# Patient Record
Sex: Male | Born: 1970 | Race: Black or African American | Hispanic: No | Smoking: Current some day smoker
Health system: Southern US, Community
[De-identification: ages and names within clinical notes are randomized; demographics above are authoritative.]

## PROBLEM LIST (undated history)

## (undated) DIAGNOSIS — R569 Unspecified convulsions: Secondary | ICD-10-CM

## (undated) DIAGNOSIS — J329 Chronic sinusitis, unspecified: Secondary | ICD-10-CM

## (undated) DIAGNOSIS — K219 Gastro-esophageal reflux disease without esophagitis: Secondary | ICD-10-CM

## (undated) DIAGNOSIS — G4730 Sleep apnea, unspecified: Secondary | ICD-10-CM

## (undated) DIAGNOSIS — I10 Essential (primary) hypertension: Secondary | ICD-10-CM

## (undated) HISTORY — PX: APPENDECTOMY: SHX54

---

## 1998-10-27 ENCOUNTER — Emergency Department (HOSPITAL_COMMUNITY): Admission: EM | Admit: 1998-10-27 | Discharge: 1998-10-27 | Payer: Self-pay | Admitting: Emergency Medicine

## 1998-11-08 ENCOUNTER — Emergency Department (HOSPITAL_COMMUNITY): Admission: EM | Admit: 1998-11-08 | Discharge: 1998-11-08 | Payer: Self-pay | Admitting: Emergency Medicine

## 1999-01-23 ENCOUNTER — Emergency Department (HOSPITAL_COMMUNITY): Admission: EM | Admit: 1999-01-23 | Discharge: 1999-01-23 | Payer: Self-pay | Admitting: Emergency Medicine

## 2003-05-30 ENCOUNTER — Emergency Department (HOSPITAL_COMMUNITY): Admission: EM | Admit: 2003-05-30 | Discharge: 2003-05-30 | Payer: Self-pay | Admitting: Emergency Medicine

## 2004-09-05 ENCOUNTER — Emergency Department (HOSPITAL_COMMUNITY): Admission: EM | Admit: 2004-09-05 | Discharge: 2004-09-05 | Payer: Self-pay | Admitting: Emergency Medicine

## 2005-11-21 ENCOUNTER — Emergency Department (HOSPITAL_COMMUNITY): Admission: EM | Admit: 2005-11-21 | Discharge: 2005-11-21 | Payer: Self-pay | Admitting: Emergency Medicine

## 2007-05-03 ENCOUNTER — Emergency Department (HOSPITAL_COMMUNITY): Admission: EM | Admit: 2007-05-03 | Discharge: 2007-05-03 | Payer: Self-pay | Admitting: Emergency Medicine

## 2007-05-05 ENCOUNTER — Emergency Department (HOSPITAL_COMMUNITY): Admission: EM | Admit: 2007-05-05 | Discharge: 2007-05-05 | Payer: Self-pay | Admitting: Emergency Medicine

## 2008-09-27 ENCOUNTER — Emergency Department (HOSPITAL_COMMUNITY): Admission: EM | Admit: 2008-09-27 | Discharge: 2008-09-27 | Payer: Self-pay | Admitting: Emergency Medicine

## 2010-11-27 LAB — BASIC METABOLIC PANEL
BUN: 7 mg/dL (ref 6–23)
Calcium: 8.9 mg/dL (ref 8.4–10.5)
Chloride: 107 mEq/L (ref 96–112)
GFR calc Af Amer: >60 mL/min (ref >60)
GFR calc non Af Amer: 52 mL/min — ABNORMAL LOW (ref >60)
Glucose, Bld: 113 mg/dL — ABNORMAL HIGH (ref 70–99)
Sodium: 143 mEq/L (ref 135–145)

## 2010-11-27 LAB — POCT CARDIAC MARKERS
Myoglobin, poc: 69.5 ng/mL (ref 12–200)
Troponin i, poc: <0.05 ng/mL (ref 0.00–0.09)
Troponin i, poc: <0.05 ng/mL (ref 0.00–0.09)

## 2010-11-27 LAB — CBC
HCT: 48.5 % (ref 39.0–52.0)
MCHC: 34.2 g/dL (ref 30.0–36.0)
MCV: 93.7 fL (ref 78.0–100.0)
RBC: 5.17 MIL/uL (ref 4.22–5.81)
RDW: 14.7 % (ref 11.5–15.5)
WBC: 4.3 10*3/uL (ref 4.0–10.5)

## 2010-11-27 LAB — DIFFERENTIAL
Lymphocytes Relative: 54 % — ABNORMAL HIGH (ref 12–46)
Lymphs Abs: 2.3 10*3/uL (ref 0.7–4.0)
Neutrophils Relative %: 33 % — ABNORMAL LOW (ref 43–77)

## 2011-02-14 ENCOUNTER — Emergency Department (HOSPITAL_COMMUNITY)
Admission: EM | Admit: 2011-02-14 | Discharge: 2011-02-14 | Disposition: A | Discharge status: Home / Self Care | Payer: Non-veteran care | Attending: Emergency Medicine | Admitting: Emergency Medicine

## 2011-02-14 DIAGNOSIS — M109 Gout, unspecified: Secondary | ICD-10-CM | POA: Insufficient documentation

## 2011-02-14 DIAGNOSIS — I10 Essential (primary) hypertension: Secondary | ICD-10-CM | POA: Insufficient documentation

## 2011-02-14 DIAGNOSIS — M79609 Pain in unspecified limb: Secondary | ICD-10-CM | POA: Insufficient documentation

## 2011-04-04 ENCOUNTER — Emergency Department (HOSPITAL_COMMUNITY)
Admission: EM | Admit: 2011-04-04 | Discharge: 2011-04-04 | Disposition: A | Discharge status: Home / Self Care | Payer: Non-veteran care | Attending: Emergency Medicine | Admitting: Emergency Medicine

## 2011-04-04 DIAGNOSIS — G40909 Epilepsy, unspecified, not intractable, without status epilepticus: Secondary | ICD-10-CM | POA: Insufficient documentation

## 2011-04-04 DIAGNOSIS — M7989 Other specified soft tissue disorders: Secondary | ICD-10-CM | POA: Insufficient documentation

## 2011-04-04 DIAGNOSIS — I10 Essential (primary) hypertension: Secondary | ICD-10-CM | POA: Insufficient documentation

## 2011-04-04 DIAGNOSIS — M79609 Pain in unspecified limb: Secondary | ICD-10-CM | POA: Insufficient documentation

## 2011-04-04 DIAGNOSIS — K219 Gastro-esophageal reflux disease without esophagitis: Secondary | ICD-10-CM | POA: Insufficient documentation

## 2011-04-04 DIAGNOSIS — M109 Gout, unspecified: Secondary | ICD-10-CM | POA: Insufficient documentation

## 2011-04-04 DIAGNOSIS — Z79899 Other long term (current) drug therapy: Secondary | ICD-10-CM | POA: Insufficient documentation

## 2013-07-29 ENCOUNTER — Encounter (HOSPITAL_COMMUNITY): Payer: Self-pay | Admitting: Emergency Medicine

## 2013-07-29 ENCOUNTER — Emergency Department (HOSPITAL_COMMUNITY)
Admission: EM | Admit: 2013-07-29 | Discharge: 2013-07-29 | Disposition: A | Discharge status: Home / Self Care | Payer: Non-veteran care | Attending: Emergency Medicine | Admitting: Emergency Medicine

## 2013-07-29 DIAGNOSIS — I10 Essential (primary) hypertension: Secondary | ICD-10-CM | POA: Insufficient documentation

## 2013-07-29 DIAGNOSIS — R040 Epistaxis: Secondary | ICD-10-CM | POA: Insufficient documentation

## 2013-07-29 DIAGNOSIS — F17200 Nicotine dependence, unspecified, uncomplicated: Secondary | ICD-10-CM | POA: Insufficient documentation

## 2013-07-29 DIAGNOSIS — Z8669 Personal history of other diseases of the nervous system and sense organs: Secondary | ICD-10-CM | POA: Insufficient documentation

## 2013-07-29 HISTORY — DX: Essential (primary) hypertension: I10

## 2013-07-29 HISTORY — DX: Unspecified convulsions: R56.9

## 2013-07-29 MED ORDER — SALINE SPRAY 0.65 % NA SOLN
1.0000 | Freq: Once | NASAL | Status: AC
  Administered 2013-07-29: 1 via NASAL
  Filled 2013-07-29: qty 44

## 2013-07-29 MED ORDER — OXYMETAZOLINE HCL 0.05 % NA SOLN
1.0000 | Freq: Once | NASAL | Status: AC
  Administered 2013-07-29: 1 via NASAL
  Filled 2013-07-29: qty 15

## 2013-07-29 NOTE — ED Notes (Signed)
Walked in to assess patient again,nose bleed has stopped.

## 2013-07-29 NOTE — ED Provider Notes (Signed)
CSN: [id number]     Arrival date & time 07/29/13  0231 History   First MD Initiated Contact with Patient 07/29/13 0407     Chief Complaint  Patient presents with  . Epistaxis   (Consider location/radiation/quality/duration/timing/severity/associated sxs/prior Treatment) Patient is a 42 y.o. male presenting with nosebleeds. The history is provided by the patient.  Epistaxis Location:  Bilateral Severity:  Mild Timing:  Intermittent Progression:  Waxing and waning Chronicity:  New Context: not anticoagulants, not aspirin use, not drug use, not nose picking and not trauma   Relieved by:  Nothing Ineffective treatments:  Applying pressure Associated symptoms: no fever   Risk factors: no alcohol use, no frequent nosebleeds and no liver disease     Past Medical History  Diagnosis Date  . Seizures   . Hypertension    History reviewed. No pertinent past surgical history. No family history on file. History  Substance Use Topics  . Smoking status: Current Every Day Smoker  . Smokeless tobacco: Not on file  . Alcohol Use: Yes    Review of Systems  Constitutional: Negative for fever.  HENT: Positive for nosebleeds.   Skin: Negative for rash and wound.  Allergic/Immunologic: Negative for immunocompromised state.  Hematological: Does not bruise/bleed easily.    Allergies  Dilantin  Home Medications  No current outpatient prescriptions on file. BP 133/89  Pulse 90  Temp(Src) 97.7 F (36.5 C) (Oral)  Resp 16  SpO2 94% Physical Exam  Nursing note and vitals reviewed. Constitutional: He appears well-developed.  HENT:  Head: Normocephalic and atraumatic.  L nare - pt has anterior bleeding source - with a scab. No active bleeding. R Nare - unable to locate the bleeding spot, but there is no active bleeding.  Cardiovascular: Normal rate.   Pulmonary/Chest: Effort normal and breath sounds normal.    ED Course  Procedures (including critical care time) Labs Review Labs  Reviewed - No data to display Imaging Review No results found.  EKG Interpretation   None       MDM   1. Epistaxis    Pt comes in with epistaxis. No active bleeding during my eval - but he was having bleeding at arrival.  No red flags on hx or exam. Will give afrin and nasal spry from the ED. Pt advised to use pressure for 5 min, uninterrupted x 2 and then AFRIN use - and to come to the ED if unsuccessful with those attempts.  Since there is no active bleeding, there is no active intervention needed.   The patient was counseled on the dangers of tobacco use, and was advised to quit.  Reviewed strategies to maximize success, including removing cigarettes and smoking materials from environment. Discussion 2-3 minutes   Derwood Kaplan, MD 07/29/13 [phone]

## 2013-07-29 NOTE — ED Notes (Signed)
C/o intermittent nosebleed since yesterday. Nose bleeding on arrival.

## 2013-09-13 ENCOUNTER — Emergency Department (HOSPITAL_COMMUNITY)
Admission: EM | Admit: 2013-09-13 | Discharge: 2013-09-13 | Disposition: A | Discharge status: Home / Self Care | Payer: No Typology Code available for payment source | Attending: Emergency Medicine | Admitting: Emergency Medicine

## 2013-09-13 ENCOUNTER — Emergency Department (HOSPITAL_COMMUNITY): Payer: No Typology Code available for payment source

## 2013-09-13 ENCOUNTER — Encounter (HOSPITAL_COMMUNITY): Payer: Self-pay | Admitting: Emergency Medicine

## 2013-09-13 DIAGNOSIS — Y9239 Other specified sports and athletic area as the place of occurrence of the external cause: Secondary | ICD-10-CM | POA: Insufficient documentation

## 2013-09-13 DIAGNOSIS — S99929A Unspecified injury of unspecified foot, initial encounter: Secondary | ICD-10-CM

## 2013-09-13 DIAGNOSIS — Y92838 Other recreation area as the place of occurrence of the external cause: Secondary | ICD-10-CM

## 2013-09-13 DIAGNOSIS — S99919A Unspecified injury of unspecified ankle, initial encounter: Secondary | ICD-10-CM

## 2013-09-13 DIAGNOSIS — G40909 Epilepsy, unspecified, not intractable, without status epilepticus: Secondary | ICD-10-CM | POA: Insufficient documentation

## 2013-09-13 DIAGNOSIS — S199XXA Unspecified injury of neck, initial encounter: Secondary | ICD-10-CM

## 2013-09-13 DIAGNOSIS — S335XXA Sprain of ligaments of lumbar spine, initial encounter: Secondary | ICD-10-CM | POA: Insufficient documentation

## 2013-09-13 DIAGNOSIS — F17200 Nicotine dependence, unspecified, uncomplicated: Secondary | ICD-10-CM | POA: Insufficient documentation

## 2013-09-13 DIAGNOSIS — S8990XA Unspecified injury of unspecified lower leg, initial encounter: Secondary | ICD-10-CM | POA: Insufficient documentation

## 2013-09-13 DIAGNOSIS — I10 Essential (primary) hypertension: Secondary | ICD-10-CM | POA: Insufficient documentation

## 2013-09-13 DIAGNOSIS — S39012A Strain of muscle, fascia and tendon of lower back, initial encounter: Secondary | ICD-10-CM

## 2013-09-13 DIAGNOSIS — Z79899 Other long term (current) drug therapy: Secondary | ICD-10-CM | POA: Insufficient documentation

## 2013-09-13 DIAGNOSIS — S0993XA Unspecified injury of face, initial encounter: Secondary | ICD-10-CM | POA: Insufficient documentation

## 2013-09-13 DIAGNOSIS — Y9389 Activity, other specified: Secondary | ICD-10-CM | POA: Insufficient documentation

## 2013-09-13 MED ORDER — HYDROCODONE-ACETAMINOPHEN 5-325 MG PO TABS
1.0000 | ORAL_TABLET | Freq: Once | ORAL | Status: AC
Start: 2013-09-13 — End: 2013-09-13
  Administered 2013-09-13: 1 via ORAL
  Filled 2013-09-13: qty 1

## 2013-09-13 MED ORDER — HYDROCODONE-ACETAMINOPHEN 5-325 MG PO TABS: 1.0000 | ORAL_TABLET | Freq: Four times a day (QID) | ORAL | Status: DC | PRN

## 2013-09-13 NOTE — ED Notes (Signed)
Patient transported to X-ray 

## 2013-09-13 NOTE — ED Notes (Signed)
Return from xray
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
## 2018-02-24 NOTE — Pre-Procedure Instructions (Signed)
Terence LuxGerald L Goller  02/24/2018     No Pharmacies Listed   Your procedure is scheduled on March 04, 2018.  [facility] at 640 AM.  Call this number if you have problems the morning of surgery:  [phone]   Remember:  Do not eat or drink after midnight.    Take these medicines the morning of surgery with A SIP OF WATER  Allopurinol (zyloprim) Fluticasone (flonase)-if needed levetiracetam (keppra) Pantoprazole (protonix)  7 days prior to surgery STOP taking any Aspirin (unless otherwise instructed by your surgeon), Aleve, Naproxen, Ibuprofen, Motrin, Advil, Goody's, BC's, all herbal medications, fish oil, and all vitamins   Do not wear jewelry, make-up or nail polish.  Do not wear lotions, powders, or perfumes, or deodorant.   Men may shave face and neck.  Do not bring valuables to the hospital.  [facility] is not responsible for any belongings or valuables.  Contacts, dentures or bridgework may not be worn into surgery.  Leave your suitcase in the car.  After surgery it may be brought to your room.  For patients admitted to the hospital, discharge time will be determined by your treatment team.  Patients discharged the day of surgery will not be allowed to drive home.    [place]- Preparing For Surgery  Before surgery, you can play an important role. Because skin is not sterile, your skin needs to be as free of germs as possible. You can reduce the number of germs on your skin by washing with CHG (chlorahexidine gluconate) Soap before surgery.  CHG is an antiseptic cleaner which kills germs and bonds with the skin to continue killing germs even after washing.    Oral Hygiene is also important to reduce your risk of infection.  Remember - BRUSH YOUR TEETH THE MORNING OF SURGERY WITH YOUR REGULAR TOOTHPASTE  Please do not use if you have an allergy to CHG or antibacterial soaps. If your skin becomes reddened/irritated stop using the CHG.  Do  not shave (including legs and underarms) for at least 48 hours prior to first CHG shower. It is OK to shave your face.  Please follow these instructions carefully.   1. Shower the NIGHT BEFORE SURGERY and the MORNING OF SURGERY with CHG.   2. If you chose to wash your hair, wash your hair first as usual with your normal shampoo.  3. After you shampoo, rinse your hair and body thoroughly to remove the shampoo.  4. Use CHG as you would any other liquid soap. You can apply CHG directly to the skin and wash gently with a scrungie or a clean washcloth.   5. Apply the CHG Soap to your body ONLY FROM THE NECK DOWN.  Do not use on open wounds or open sores. Avoid contact with your eyes, ears, mouth and genitals (private parts). Wash Face and genitals (private parts)  with your normal soap.  6. Wash thoroughly, paying special attention to the area where your surgery will be performed.  7. Thoroughly rinse your body with warm water from the neck down.  8. DO NOT shower/wash with your normal soap after using and rinsing off the CHG Soap.  9. Pat yourself dry with a CLEAN TOWEL.  10. Wear CLEAN PAJAMAS to bed the night before surgery, wear comfortable clothes the morning of surgery  11. Place CLEAN SHEETS on your bed the night of your first shower and DO NOT SLEEP WITH PETS.  Day of  Surgery:  Do not apply any deodorants/lotions.  Please wear clean clothes to the hospital/surgery center.   Remember to brush your teeth WITH YOUR REGULAR TOOTHPASTE.  Please read over the following fact sheets that you were given. Pain Booklet, Coughing and Deep Breathing and Surgical Site Infection Prevention

## 2018-02-25 ENCOUNTER — Other Ambulatory Visit (HOSPITAL_COMMUNITY): Payer: Non-veteran care

## 2018-02-25 ENCOUNTER — Encounter (HOSPITAL_COMMUNITY): Payer: Self-pay

## 2018-02-25 ENCOUNTER — Other Ambulatory Visit: Payer: Self-pay

## 2018-02-25 ENCOUNTER — Encounter (HOSPITAL_COMMUNITY)
Admission: RE | Admit: 2018-02-25 | Discharge: 2018-02-25 | Disposition: A | Discharge status: Home / Self Care | Payer: No Typology Code available for payment source | Source: Ambulatory Visit | Attending: Otolaryngology | Admitting: Otolaryngology

## 2018-02-25 DIAGNOSIS — I10 Essential (primary) hypertension: Secondary | ICD-10-CM | POA: Diagnosis not present

## 2018-02-25 DIAGNOSIS — J329 Chronic sinusitis, unspecified: Secondary | ICD-10-CM | POA: Insufficient documentation

## 2018-02-25 DIAGNOSIS — Z01812 Encounter for preprocedural laboratory examination: Secondary | ICD-10-CM | POA: Diagnosis not present

## 2018-02-25 DIAGNOSIS — K219 Gastro-esophageal reflux disease without esophagitis: Secondary | ICD-10-CM | POA: Diagnosis not present

## 2018-02-25 DIAGNOSIS — Z79899 Other long term (current) drug therapy: Secondary | ICD-10-CM | POA: Insufficient documentation

## 2018-02-25 DIAGNOSIS — G4733 Obstructive sleep apnea (adult) (pediatric): Secondary | ICD-10-CM | POA: Insufficient documentation

## 2018-02-25 DIAGNOSIS — F17200 Nicotine dependence, unspecified, uncomplicated: Secondary | ICD-10-CM | POA: Diagnosis not present

## 2018-02-25 DIAGNOSIS — Z7951 Long term (current) use of inhaled steroids: Secondary | ICD-10-CM | POA: Diagnosis not present

## 2018-02-25 DIAGNOSIS — R569 Unspecified convulsions: Secondary | ICD-10-CM | POA: Diagnosis not present

## 2018-02-25 DIAGNOSIS — Z01818 Encounter for other preprocedural examination: Secondary | ICD-10-CM | POA: Insufficient documentation

## 2018-02-25 HISTORY — DX: Gastro-esophageal reflux disease without esophagitis: K21.9

## 2018-02-25 HISTORY — DX: Sleep apnea, unspecified: G47.30

## 2018-02-25 HISTORY — DX: Chronic sinusitis, unspecified: J32.9

## 2018-02-25 LAB — BASIC METABOLIC PANEL
Anion gap: 12 (ref 5–15)
BUN: 13 mg/dL (ref 6–20)
CALCIUM: 9.2 mg/dL (ref 8.9–10.3)
CHLORIDE: 104 mmol/L (ref 98–111)
CO2: 25 mmol/L (ref 22–32)
CREATININE: 1.34 mg/dL — AB (ref 0.61–1.24)
GFR calc Af Amer: >60 mL/min (ref >60)
GFR calc non Af Amer: >60 mL/min (ref >60)
GLUCOSE: 112 mg/dL — AB (ref 70–99)
Potassium: 3.9 mmol/L (ref 3.5–5.1)
Sodium: 141 mmol/L (ref 135–145)

## 2018-02-25 LAB — CBC
HCT: 45.9 % (ref 39.0–52.0)
Hemoglobin: 15 g/dL (ref 13.0–17.0)
MCH: 29 pg (ref 26.0–34.0)
MCHC: 32.7 g/dL (ref 30.0–36.0)
MCV: 88.6 fL (ref 78.0–100.0)
PLATELETS: 288 10*3/uL (ref 150–400)
RBC: 5.18 MIL/uL (ref 4.22–5.81)
RDW: 13.6 % (ref 11.5–15.5)
WBC: 5.6 10*3/uL (ref 4.0–10.5)

## 2018-02-25 NOTE — Progress Notes (Addendum)
[facility]  Cardiologist - Denies  Chest x-ray - Denies  EKG - 02/25/18  Stress Test - Denies  ECHO - Denies  Cardiac Cath - Denies  Sleep Study - Yes- Positive CPAP - Yes  LABS- 02/25/18: CBC, BMP  ASA- Denies  Due to pt's elevated BP, pt made aware that he needs to f/u with his pcp. Pt denied h/a, dizziness, or blurred vision. Pt sts his diastolic runs at 98 at home. Pt sts he has an appt on 7/18, for a bp check. Pt also made aware that his surgery can be cancelled due to an abnormal bp reading.  Anesthesia- Yes- EKG/ elevated bp  Pt denies having chest pain, sob, or fever at this time. All instructions explained to the pt, with a verbal understanding of the material. Pt agrees to go over the instructions while at home for a better understanding. The opportunity to ask questions was provided.

## 2018-02-26 ENCOUNTER — Encounter (HOSPITAL_COMMUNITY): Payer: Self-pay | Admitting: Certified Registered Nurse Anesthetist

## 2018-02-26 ENCOUNTER — Encounter (HOSPITAL_COMMUNITY): Payer: Self-pay | Admitting: Emergency Medicine

## 2018-02-26 NOTE — Progress Notes (Signed)
Anesthesia Chart Review:   Case:  [id number] Date/Time:  03/04/18 0830   Procedure:  ENDOSCOPIC SINUS SURGERY/ethmoid, maxillary, frontal, sphenoid sinus (Bilateral )   Anesthesia type:  General   Pre-op diagnosis:  Chronic sinusitis   Location:  [facility] ROOM 09 / [facility] OR   Surgeon:  Serena Colonelosen, Jefry, MD      DISCUSSION: -Pt is a 47 year old male with hx HTN, OSA, seizures. Current smoker.   - BP initially 176/104.  Recheck 163/107.  Pt to f/u with PCP about uncontrolled HTN.  Pt understands surgery could be cancelled for high BP. I left voicemail for [facility] Anaracy in Dr. Lucky Rathkeosen's office about pt's uncontrolled HTN.    VS: BP (!) 163/107 Comment: Repeat/ Left  Pulse 87   Temp 36.7 C   Resp 20   Ht 5\' 7"  (1.702 m)   Wt 211 lb 11.2 oz (96 kg)   SpO2 98%   BMI 33.16 kg/m    PROVIDERS: - [facility]   LABS: Labs reviewed: Acceptable for surgery. (all labs ordered are listed, but only abnormal results are displayed)  Labs Reviewed  BASIC METABOLIC PANEL - Abnormal; Notable for the following components:      Result Value   Glucose, Bld 112 (*)    Creatinine, Ser 1.34 (*)    All other components within normal limits  CBC    EKG 02/25/18: NSR. Cannot rule out Anterior infarct, age undetermined   Past Medical History:  Diagnosis Date  . Chronic sinusitis   . GERD (gastroesophageal reflux disease)   . Hypertension   . Seizures (HCC)   . Sleep apnea    Uses Cpap    Past Surgical History:  Procedure Laterality Date  . APPENDECTOMY      MEDICATIONS: . allopurinol (ZYLOPRIM) 300 MG tablet  . fluticasone (FLONASE) 50 MCG/ACT nasal spray  . hydrocortisone cream 1 %  . levETIRAcetam (KEPPRA) 250 MG tablet  . losartan (COZAAR) 100 MG tablet  . pantoprazole (PROTONIX) 20 MG tablet   No current facility-administered medications for this encounter.     If BP acceptable day of surgery, I anticipate pt can proceed with surgery as scheduled.  Rica Mastngela Kolleen Ochsner,  FNP-BC [facility]Anesthesiology Phone: [phone] 02/26/2018 12:17 PM

## 2018-03-03 NOTE — H&P (Signed)
Chief Complaint  Patient presents with  . Chronic sinus   Parker Wherley is a 47 y.o. male who presents as a new patient for chronic rhinitis and sinusitis. For the past year he reports fluctuating nasal congestion and clear rhinorrhea. Rhinorrhea does not occur daily but frequently. It is bilateral. Also reports some midfacial pressure along the cheek sinuses and nose. He uses Flonase periodically if he feels he needs it. No use of oral anti-histamines. Recent sinus CT ordered through the [place] on 11/20/17 demonstrating a prominent nasal septal deviation, chronic pansinusitis with mild polypoid mucoperiosteal thickening with obstruction of the maxillary sinus ostia nad hiatus semilunaris but no sinus effusion to suggest acute sinusitis (per report). Small maxillary sinus retention cysts noted bilaterally as well.  No recent antibiotics. He uses CPAP for OSA.  Denies fever, headache, purulent rhinorrhea, acute vision changes, dizziness, ear or throat symptoms.  No PMH of asthma or aspirin sensitivity. He denies a history of allergies or recurrent sinusitis. No history of nasal trauma or sinonasal surgery. No formal allergy testing.  Current smoker.  PMH/Meds/All/SocHx/FamHx/ROS:   Past Medical History      Past Medical History:  Diagnosis Date  . Allergy   . Hyperlexia   . Hypertension       Past Surgical History       Past Surgical History:  Procedure Laterality Date  . APPENDECTOMY        No family history of bleeding disorders, wound healing problems or difficulty with anesthesia.   Social History  Social History        Social History  . Marital status: Married    Spouse name: N/A  . Number of children: N/A  . Years of education: N/A      Occupational History  . Not on file.           Social History Main Topics    . Smoking status: Current Every Day Smoker   . Smokeless tobacco: Never Used   . Alcohol use Yes      Comment: Social    .  Drug use: Unknown  . Sexual activity: Not on file        Other Topics Concern  . Not on file      Social History Narrative  . No narrative on file       Current Outpatient Prescriptions:  .  allopurinol (ZYLOPRIM) 300 MG tablet, Take 300 mg by mouth daily., Disp: , Rfl:  .  fluticasone propionate (FLOVENT DISKUS) 50 mcg/actuation diskus inhaler, Inhale 100 mcg into the lungs 2 times daily., Disp: , Rfl:  .  levETIRAcetam (KEPPRA) 750 MG tablet, Take 750 mg by mouth 2 times daily., Disp: , Rfl:  .  losartan potassium (LOSARTAN ORAL), Take by mouth., Disp: , Rfl:  .  pantoprazole (PROTONIX) 40 MG tablet, Take 40 mg by mouth daily., Disp: , Rfl:  .  sildenafil (VIAGRA) 50 MG tablet, Take 50 mg by mouth daily as needed for Erectile Dysfunction., Disp: , Rfl:  .  terbinafine HCl (LAMISIL) 1 % cream, Apply topically daily., Disp: , Rfl:   A complete ROS was performed with pertinent positives/negatives noted in the HPI. The remainder of the ROS are negative.    Physical Exam:    Ht 1.702 m (5\' 7" )   Wt 97.5 kg (215 lb)   BMI 33.67 kg/m    General Awake, at baseline alertness during examination.  Eyes No scleral icterus or conjunctival hemorrhage. Globe position appears normal.  EOMI.   Right Ear EAC with excessive cerumen, unable to fully visualize TM.   Left Ear EAC with excessive cerumen, unable to fully visualize TM.   Nose Patent, no polyps or masses seen on anterior rhinoscopy.  Oral cavity No mucosal lesions or tumors seen. Tongue midline.   Oropharynx Symmetric tonsils.   Neck No abnormal cervical lymphadenopathy. No thyromegaly. No thyroid masses palpated.  Cardio-vascular No cyanosis.  Pulmonary No audible stridor. Breathing easily with no labor.  Neuro Symmetric facial movement.   Psychiatry Appropriate affect and mood for clinic visit.   Independent Review of Additional Tests or Records:  Medical records, outside sinus CT.  Procedures:   None   Impression & Plans:  Haskell RilingGerald Toman is a 47 y.o. male with one year of fluctuating nasal congestion, clear rhinorrhea and facial pressure. Recent maxillofacial CT demonstrated chronic pansinusitis with obstruction of the osteomeatal complexes and deviated nasal septum.   Recommend a course of Clindamycin 300mg  TID for 10 days (written prescription provided). Eat a yogurt with active cultures daily. Also recommend he use Flonase daily for maximum benefit; 2 puffs each nostril. Also take an oral anti-histamine such as Zyrtec, Allegra or Claritin.   Follow up in three weeks for recheck and repeat maxillofacial CT imaging.  I recommended patient see his primary care soon for elevated blood pressure readings today; he states he is seeing a provider later today.

## 2018-03-04 ENCOUNTER — Ambulatory Visit (HOSPITAL_COMMUNITY)
Admission: RE | Admit: 2018-03-04 | Discharge: 2018-03-04 | Disposition: A | Discharge status: Home / Self Care | Payer: No Typology Code available for payment source | Source: Ambulatory Visit | Attending: Otolaryngology | Admitting: Otolaryngology

## 2018-03-04 ENCOUNTER — Encounter (HOSPITAL_COMMUNITY): Payer: Self-pay | Admitting: Certified Registered Nurse Anesthetist

## 2018-03-04 ENCOUNTER — Encounter (HOSPITAL_COMMUNITY)
Admission: RE | Disposition: A | Discharge status: Home / Self Care | Payer: Self-pay | Source: Ambulatory Visit | Attending: Otolaryngology

## 2018-03-04 DIAGNOSIS — Z79899 Other long term (current) drug therapy: Secondary | ICD-10-CM | POA: Diagnosis not present

## 2018-03-04 DIAGNOSIS — J310 Chronic rhinitis: Secondary | ICD-10-CM | POA: Insufficient documentation

## 2018-03-04 DIAGNOSIS — Z5309 Procedure and treatment not carried out because of other contraindication: Secondary | ICD-10-CM | POA: Insufficient documentation

## 2018-03-04 DIAGNOSIS — F17200 Nicotine dependence, unspecified, uncomplicated: Secondary | ICD-10-CM | POA: Diagnosis not present

## 2018-03-04 DIAGNOSIS — Z7951 Long term (current) use of inhaled steroids: Secondary | ICD-10-CM | POA: Insufficient documentation

## 2018-03-04 DIAGNOSIS — G4733 Obstructive sleep apnea (adult) (pediatric): Secondary | ICD-10-CM | POA: Diagnosis not present

## 2018-03-04 DIAGNOSIS — Z9989 Dependence on other enabling machines and devices: Secondary | ICD-10-CM | POA: Insufficient documentation

## 2018-03-04 DIAGNOSIS — J324 Chronic pansinusitis: Secondary | ICD-10-CM | POA: Insufficient documentation

## 2018-03-04 DIAGNOSIS — I10 Essential (primary) hypertension: Secondary | ICD-10-CM | POA: Diagnosis not present

## 2018-03-04 SURGERY — SINUS SURGERY, ENDOSCOPIC
Anesthesia: General | Laterality: Bilateral

## 2018-03-04 MED ORDER — OXYMETAZOLINE HCL 0.05 % NA SOLN
NASAL | Status: AC
  Filled 2018-03-04: qty 15

## 2018-03-04 MED ORDER — LIDOCAINE-EPINEPHRINE 1 %-1:100000 IJ SOLN
INTRAMUSCULAR | Status: AC
  Filled 2018-03-04: qty 1

## 2018-03-04 MED ORDER — PROPOFOL 10 MG/ML IV BOLUS
INTRAVENOUS | Status: AC
  Filled 2018-03-04: qty 20

## 2018-03-04 MED ORDER — MIDAZOLAM HCL 2 MG/2ML IJ SOLN
INTRAMUSCULAR | Status: AC
  Filled 2018-03-04: qty 2

## 2018-03-04 MED ORDER — FENTANYL CITRATE (PF) 250 MCG/5ML IJ SOLN
INTRAMUSCULAR | Status: AC
  Filled 2018-03-04: qty 5

## 2018-03-04 MED ORDER — BACITRACIN ZINC 500 UNIT/GM EX OINT
TOPICAL_OINTMENT | CUTANEOUS | Status: AC
  Filled 2018-03-04: qty 28.35

## 2018-03-04 MED ORDER — LACTATED RINGERS IV SOLN: INTRAVENOUS | Status: DC

## 2018-03-04 MED ORDER — OXYMETAZOLINE HCL 0.05 % NA SOLN: 2.0000 | NASAL | Status: DC

## 2018-03-04 SURGICAL SUPPLY — 35 items
BLADE RAD40 ROTATE 4M 4 5PK (BLADE) IMPLANT
BLADE RAD40 ROTATE 4M 4MM 5PK (BLADE)
BLADE RAD60 ROTATE M4 4 5PK (BLADE) IMPLANT
BLADE RAD60 ROTATE M4 4MM 5PK (BLADE)
BLADE TRICUT ROTATE M4 4 5PK (BLADE) ×2 IMPLANT
BLADE TRICUT ROTATE M4 4MM 5PK (BLADE) ×1
CANISTER SUCT 3000ML PPV (MISCELLANEOUS) ×3 IMPLANT
DRAPE HALF SHEET 40X57 (DRAPES) IMPLANT
DRESSING NASAL KENNEDY 3.5X.9 (MISCELLANEOUS) IMPLANT
DRSG NASAL KENNEDY 3.5X.9 (MISCELLANEOUS)
DRSG NASOPORE 8CM (GAUZE/BANDAGES/DRESSINGS) IMPLANT
ELECT REM PT RETURN 9FT ADLT (ELECTROSURGICAL)
ELECTRODE REM PT RTRN 9FT ADLT (ELECTROSURGICAL) IMPLANT
FILTER ARTHROSCOPY CONVERTOR (FILTER) IMPLANT
GLOVE ECLIPSE 7.5 STRL STRAW (GLOVE) ×3 IMPLANT
GOWN STRL REUS W/ TWL LRG LVL3 (GOWN DISPOSABLE) ×2 IMPLANT
GOWN STRL REUS W/TWL LRG LVL3 (GOWN DISPOSABLE) ×4
KIT BASIN OR (CUSTOM PROCEDURE TRAY) ×3 IMPLANT
KIT TURNOVER KIT B (KITS) ×3 IMPLANT
NEEDLE PRECISIONGLIDE 27X1.5 (NEEDLE) ×3 IMPLANT
NS IRRIG 1000ML POUR BTL (IV SOLUTION) ×3 IMPLANT
PAD ARMBOARD 7.5X6 YLW CONV (MISCELLANEOUS) ×6 IMPLANT
PATTIES SURGICAL .5 X3 (DISPOSABLE) ×3 IMPLANT
SHEATH ENDOSCRUB 0 DEG (SHEATH) IMPLANT
SHEATH ENDOSCRUB 30 DEG (SHEATH) IMPLANT
SPECIMEN JAR SMALL (MISCELLANEOUS) ×3 IMPLANT
SWAB COLLECTION DEVICE MRSA (MISCELLANEOUS) IMPLANT
SWAB CULTURE ESWAB REG 1ML (MISCELLANEOUS) IMPLANT
SYR 50ML SLIP (SYRINGE) IMPLANT
TOWEL OR 17X24 6PK STRL BLUE (TOWEL DISPOSABLE) ×3 IMPLANT
TRAY ENT MC OR (CUSTOM PROCEDURE TRAY) ×3 IMPLANT
TUBE CONNECTING 12'X1/4 (SUCTIONS) ×1
TUBE CONNECTING 12X1/4 (SUCTIONS) ×2 IMPLANT
TUBING EXTENTION W/L.L. (IV SETS) IMPLANT
WATER STERILE IRR 1000ML POUR (IV SOLUTION) ×3 IMPLANT

## 2018-03-04 NOTE — Progress Notes (Signed)
Procedure cancelled due to high blood pressure.

## 2018-03-04 NOTE — Progress Notes (Signed)
Patient's blood pressure 189/132, 205/132, 175/119 on arrival to short stay.  Patient states that he did not see his PCP after his PAT appointment but his PCP was notified.  Patient stated that PCP said to continue to take his medicine.  Dr. Chilton SiGreen notified.

## 2018-03-31 ENCOUNTER — Other Ambulatory Visit: Payer: Self-pay

## 2018-03-31 ENCOUNTER — Encounter (HOSPITAL_BASED_OUTPATIENT_CLINIC_OR_DEPARTMENT_OTHER): Payer: Self-pay | Admitting: *Deleted

## 2018-04-03 ENCOUNTER — Encounter (HOSPITAL_BASED_OUTPATIENT_CLINIC_OR_DEPARTMENT_OTHER): Payer: Self-pay | Admitting: Anesthesiology

## 2018-04-03 NOTE — Anesthesia Preprocedure Evaluation (Addendum)
Anesthesia Evaluation  Patient identified by MRN, date of birth, ID band Patient awake    Reviewed: Allergy & Precautions, NPO status , Patient's Chart, lab work & pertinent test results  Airway Mallampati: II       Dental no notable dental hx. (+) Teeth Intact, Poor Dentition   Pulmonary sleep apnea and Continuous Positive Airway Pressure Ventilation , Current Smoker,    Pulmonary exam normal breath sounds clear to auscultation       Cardiovascular hypertension, Pt. on medications Normal cardiovascular exam Rhythm:Regular Rate:Normal     Neuro/Psych Seizures -,     GI/Hepatic Neg liver ROS, GERD  Medicated,  Endo/Other  negative endocrine ROS  Renal/GU negative Renal ROS     Musculoskeletal negative musculoskeletal ROS (+)   Abdominal (+) + obese,   Peds  Hematology negative hematology ROS (+)   Anesthesia Other Findings   Reproductive/Obstetrics                            Lab Results  Component Value Date   WBC 5.6 02/25/2018   HGB 15.0 02/25/2018   HCT 45.9 02/25/2018   MCV 88.6 02/25/2018   PLT 288 02/25/2018   Lab Results  Component Value Date   CREATININE 1.34 (H) 02/25/2018   BUN 13 02/25/2018   NA 141 02/25/2018   K 3.9 02/25/2018   CL 104 02/25/2018   CO2 25 02/25/2018   No results found for: INR, PROTIME  EKG: normal sinus rhythm.  Anesthesia Physical Anesthesia Plan  ASA: II  Anesthesia Plan: General   Post-op Pain Management:    Induction: Intravenous  PONV Risk Score and Plan: 2 and Ondansetron and Dexamethasone  Airway Management Planned: Oral ETT  Additional Equipment: None  Intra-op Plan:   Post-operative Plan: Extubation in OR  Informed Consent: I have reviewed the patients History and Physical, chart, labs and discussed the procedure including the risks, benefits and alternatives for the proposed anesthesia with the patient or authorized  representative who has indicated his/her understanding and acceptance.   Dental advisory given  Plan Discussed with: CRNA and Surgeon  Anesthesia Plan Comments:        Anesthesia Quick Evaluation

## 2018-04-06 ENCOUNTER — Ambulatory Visit (HOSPITAL_BASED_OUTPATIENT_CLINIC_OR_DEPARTMENT_OTHER): Payer: No Typology Code available for payment source | Admitting: Anesthesiology

## 2018-04-06 ENCOUNTER — Ambulatory Visit (HOSPITAL_BASED_OUTPATIENT_CLINIC_OR_DEPARTMENT_OTHER)
Admission: RE | Admit: 2018-04-06 | Discharge: 2018-04-06 | Disposition: A | Discharge status: Home / Self Care | Payer: No Typology Code available for payment source | Source: Ambulatory Visit | Attending: Otolaryngology | Admitting: Otolaryngology

## 2018-04-06 ENCOUNTER — Encounter (HOSPITAL_BASED_OUTPATIENT_CLINIC_OR_DEPARTMENT_OTHER)
Admission: RE | Disposition: A | Discharge status: Home / Self Care | Payer: Self-pay | Source: Ambulatory Visit | Attending: Otolaryngology

## 2018-04-06 ENCOUNTER — Other Ambulatory Visit: Payer: Self-pay

## 2018-04-06 ENCOUNTER — Encounter (HOSPITAL_BASED_OUTPATIENT_CLINIC_OR_DEPARTMENT_OTHER): Payer: Self-pay | Admitting: Anesthesiology

## 2018-04-06 DIAGNOSIS — J320 Chronic maxillary sinusitis: Secondary | ICD-10-CM | POA: Insufficient documentation

## 2018-04-06 DIAGNOSIS — J324 Chronic pansinusitis: Secondary | ICD-10-CM | POA: Diagnosis not present

## 2018-04-06 DIAGNOSIS — N529 Male erectile dysfunction, unspecified: Secondary | ICD-10-CM | POA: Diagnosis not present

## 2018-04-06 DIAGNOSIS — I10 Essential (primary) hypertension: Secondary | ICD-10-CM | POA: Diagnosis not present

## 2018-04-06 DIAGNOSIS — Z79899 Other long term (current) drug therapy: Secondary | ICD-10-CM | POA: Diagnosis not present

## 2018-04-06 DIAGNOSIS — J342 Deviated nasal septum: Secondary | ICD-10-CM | POA: Insufficient documentation

## 2018-04-06 DIAGNOSIS — F17200 Nicotine dependence, unspecified, uncomplicated: Secondary | ICD-10-CM | POA: Diagnosis not present

## 2018-04-06 HISTORY — PX: NASAL SINUS SURGERY: SHX719

## 2018-04-06 SURGERY — SINUS SURGERY, ENDOSCOPIC
Anesthesia: General | Laterality: Bilateral

## 2018-04-06 MED ORDER — MIDAZOLAM HCL 2 MG/2ML IJ SOLN
INTRAMUSCULAR | Status: AC
  Filled 2018-04-06: qty 2

## 2018-04-06 MED ORDER — OXYCODONE HCL 5 MG PO TABS
ORAL_TABLET | ORAL | Status: AC
  Filled 2018-04-06: qty 1

## 2018-04-06 MED ORDER — PHENYLEPHRINE 40 MCG/ML (10ML) SYRINGE FOR IV PUSH (FOR BLOOD PRESSURE SUPPORT)
PREFILLED_SYRINGE | INTRAVENOUS | Status: DC | PRN
  Administered 2018-04-06 (×2): 80 ug via INTRAVENOUS

## 2018-04-06 MED ORDER — HYDROCODONE-ACETAMINOPHEN 7.5-325 MG PO TABS: 1.0000 | ORAL_TABLET | Freq: Four times a day (QID) | ORAL | 0 refills | Status: AC | PRN

## 2018-04-06 MED ORDER — OXYMETAZOLINE HCL 0.05 % NA SOLN
NASAL | Status: AC
  Filled 2018-04-06: qty 15

## 2018-04-06 MED ORDER — RACEPINEPHRINE HCL 2.25 % IN NEBU
0.5000 mL | INHALATION_SOLUTION | Freq: Once | RESPIRATORY_TRACT | Status: AC
  Administered 2018-04-06: 0.5 mL via RESPIRATORY_TRACT

## 2018-04-06 MED ORDER — DEXAMETHASONE SODIUM PHOSPHATE 10 MG/ML IJ SOLN
10.0000 mg | Freq: Once | INTRAMUSCULAR | Status: AC
  Administered 2018-04-06: 10 mg via INTRAVENOUS

## 2018-04-06 MED ORDER — SCOPOLAMINE 1 MG/3DAYS TD PT72: 1.0000 | MEDICATED_PATCH | Freq: Once | TRANSDERMAL | Status: DC | PRN

## 2018-04-06 MED ORDER — ACETAMINOPHEN 325 MG PO TABS: 325.0000 mg | ORAL_TABLET | ORAL | Status: DC | PRN

## 2018-04-06 MED ORDER — PROMETHAZINE HCL 25 MG RE SUPP: 25.0000 mg | Freq: Four times a day (QID) | RECTAL | 1 refills | Status: AC | PRN

## 2018-04-06 MED ORDER — BACITRACIN ZINC 500 UNIT/GM EX OINT
TOPICAL_OINTMENT | CUTANEOUS | Status: DC | PRN
  Administered 2018-04-06: 1 via TOPICAL

## 2018-04-06 MED ORDER — FENTANYL CITRATE (PF) 100 MCG/2ML IJ SOLN: 25.0000 ug | INTRAMUSCULAR | Status: DC | PRN

## 2018-04-06 MED ORDER — PROMETHAZINE HCL 25 MG/ML IJ SOLN: 6.2500 mg | INTRAMUSCULAR | Status: DC | PRN

## 2018-04-06 MED ORDER — OXYMETAZOLINE HCL 0.05 % NA SOLN
NASAL | Status: DC | PRN
  Administered 2018-04-06: 1 via TOPICAL

## 2018-04-06 MED ORDER — FENTANYL CITRATE (PF) 100 MCG/2ML IJ SOLN
INTRAMUSCULAR | Status: AC
  Filled 2018-04-06: qty 2

## 2018-04-06 MED ORDER — ARTIFICIAL TEARS OPHTHALMIC OINT
TOPICAL_OINTMENT | OPHTHALMIC | Status: AC
  Filled 2018-04-06: qty 3.5

## 2018-04-06 MED ORDER — ONDANSETRON HCL 4 MG/2ML IJ SOLN: 4.0000 mg | Freq: Once | INTRAMUSCULAR | Status: DC | PRN

## 2018-04-06 MED ORDER — LIDOCAINE HCL (CARDIAC) PF 100 MG/5ML IV SOSY
PREFILLED_SYRINGE | INTRAVENOUS | Status: DC | PRN
  Administered 2018-04-06: 100 mg via INTRAVENOUS

## 2018-04-06 MED ORDER — MEPERIDINE HCL 25 MG/ML IJ SOLN: 6.2500 mg | INTRAMUSCULAR | Status: DC | PRN

## 2018-04-06 MED ORDER — LIDOCAINE-EPINEPHRINE 1 %-1:100000 IJ SOLN
INTRAMUSCULAR | Status: DC | PRN
  Administered 2018-04-06: 8 mL

## 2018-04-06 MED ORDER — LACTATED RINGERS IV SOLN
INTRAVENOUS | Status: DC
  Administered 2018-04-06 (×2): via INTRAVENOUS

## 2018-04-06 MED ORDER — ACETAMINOPHEN 160 MG/5ML PO SOLN: 325.0000 mg | ORAL | Status: DC | PRN

## 2018-04-06 MED ORDER — SODIUM CHLORIDE 0.9 % IV SOLN
INTRAVENOUS | Status: DC | PRN
  Administered 2018-04-06: 50 ug/min via INTRAVENOUS

## 2018-04-06 MED ORDER — MIDAZOLAM HCL 5 MG/5ML IJ SOLN
INTRAMUSCULAR | Status: DC | PRN
  Administered 2018-04-06: 2 mg via INTRAVENOUS

## 2018-04-06 MED ORDER — LIDOCAINE 2% (20 MG/ML) 5 ML SYRINGE
INTRAMUSCULAR | Status: AC
  Filled 2018-04-06: qty 5

## 2018-04-06 MED ORDER — SODIUM CHLORIDE 0.9 % IN NEBU
INHALATION_SOLUTION | RESPIRATORY_TRACT | Status: AC
  Filled 2018-04-06: qty 3

## 2018-04-06 MED ORDER — DEXAMETHASONE SODIUM PHOSPHATE 10 MG/ML IJ SOLN
INTRAMUSCULAR | Status: AC
  Filled 2018-04-06: qty 1

## 2018-04-06 MED ORDER — SUCCINYLCHOLINE CHLORIDE 200 MG/10ML IV SOSY
PREFILLED_SYRINGE | INTRAVENOUS | Status: AC
  Filled 2018-04-06: qty 10

## 2018-04-06 MED ORDER — ONDANSETRON HCL 4 MG/2ML IJ SOLN
INTRAMUSCULAR | Status: DC | PRN
  Administered 2018-04-06: 4 mg via INTRAVENOUS

## 2018-04-06 MED ORDER — MIDAZOLAM HCL 2 MG/2ML IJ SOLN: 1.0000 mg | INTRAMUSCULAR | Status: DC | PRN

## 2018-04-06 MED ORDER — FENTANYL CITRATE (PF) 100 MCG/2ML IJ SOLN: 50.0000 ug | INTRAMUSCULAR | Status: DC | PRN

## 2018-04-06 MED ORDER — ACETAMINOPHEN 10 MG/ML IV SOLN: 1000.0000 mg | Freq: Once | INTRAVENOUS | Status: DC | PRN

## 2018-04-06 MED ORDER — OXYCODONE HCL 5 MG/5ML PO SOLN: 5.0000 mg | Freq: Once | ORAL | Status: DC | PRN

## 2018-04-06 MED ORDER — ONDANSETRON HCL 4 MG/2ML IJ SOLN
INTRAMUSCULAR | Status: AC
  Filled 2018-04-06: qty 2

## 2018-04-06 MED ORDER — EPHEDRINE SULFATE-NACL 50-0.9 MG/10ML-% IV SOSY
PREFILLED_SYRINGE | INTRAVENOUS | Status: DC | PRN
  Administered 2018-04-06 (×2): 25 mg via INTRAVENOUS

## 2018-04-06 MED ORDER — PROPOFOL 10 MG/ML IV BOLUS
INTRAVENOUS | Status: DC | PRN
  Administered 2018-04-06: 200 mg via INTRAVENOUS

## 2018-04-06 MED ORDER — ALBUTEROL SULFATE HFA 108 (90 BASE) MCG/ACT IN AERS
INHALATION_SPRAY | RESPIRATORY_TRACT | Status: AC
  Filled 2018-04-06: qty 6.7

## 2018-04-06 MED ORDER — FENTANYL CITRATE (PF) 100 MCG/2ML IJ SOLN
INTRAMUSCULAR | Status: DC | PRN
  Administered 2018-04-06: 50 ug via INTRAVENOUS
  Administered 2018-04-06: 100 ug via INTRAVENOUS

## 2018-04-06 MED ORDER — ROCURONIUM BROMIDE 50 MG/5ML IV SOSY
PREFILLED_SYRINGE | INTRAVENOUS | Status: DC | PRN
  Administered 2018-04-06: 50 mg via INTRAVENOUS

## 2018-04-06 MED ORDER — OXYCODONE HCL 5 MG/5ML PO SOLN: 5.0000 mg | Freq: Once | ORAL | Status: AC | PRN

## 2018-04-06 MED ORDER — ALBUTEROL SULFATE HFA 108 (90 BASE) MCG/ACT IN AERS
INHALATION_SPRAY | RESPIRATORY_TRACT | Status: DC | PRN
  Administered 2018-04-06 (×3): 4 via RESPIRATORY_TRACT

## 2018-04-06 MED ORDER — HYDROMORPHONE HCL 1 MG/ML IJ SOLN: 0.2500 mg | INTRAMUSCULAR | Status: DC | PRN

## 2018-04-06 MED ORDER — MUPIROCIN 2 % EX OINT
TOPICAL_OINTMENT | CUTANEOUS | Status: AC
  Filled 2018-04-06: qty 22

## 2018-04-06 MED ORDER — SUGAMMADEX SODIUM 200 MG/2ML IV SOLN
INTRAVENOUS | Status: DC | PRN
  Administered 2018-04-06: 200 mg via INTRAVENOUS

## 2018-04-06 MED ORDER — OXYCODONE HCL 5 MG PO TABS
5.0000 mg | ORAL_TABLET | Freq: Once | ORAL | Status: AC | PRN
  Administered 2018-04-06: 5 mg via ORAL

## 2018-04-06 MED ORDER — LIDOCAINE-EPINEPHRINE 1 %-1:100000 IJ SOLN
INTRAMUSCULAR | Status: AC
  Filled 2018-04-06: qty 1

## 2018-04-06 MED ORDER — LACTATED RINGERS IV SOLN: INTRAVENOUS | Status: DC

## 2018-04-06 MED ORDER — PROPOFOL 500 MG/50ML IV EMUL
INTRAVENOUS | Status: AC
  Filled 2018-04-06: qty 50

## 2018-04-06 MED ORDER — OXYCODONE HCL 5 MG PO TABS: 5.0000 mg | ORAL_TABLET | Freq: Once | ORAL | Status: DC | PRN

## 2018-04-06 MED ORDER — OXYMETAZOLINE HCL 0.05 % NA SOLN
2.0000 | NASAL | Status: AC
  Administered 2018-04-06 (×3): 2 via NASAL

## 2018-04-06 MED ORDER — PHENYLEPHRINE 40 MCG/ML (10ML) SYRINGE FOR IV PUSH (FOR BLOOD PRESSURE SUPPORT)
PREFILLED_SYRINGE | INTRAVENOUS | Status: AC
  Filled 2018-04-06: qty 10

## 2018-04-06 MED ORDER — RACEPINEPHRINE HCL 2.25 % IN NEBU
INHALATION_SOLUTION | RESPIRATORY_TRACT | Status: AC
  Filled 2018-04-06: qty 0.5

## 2018-04-06 MED ORDER — BACITRACIN ZINC 500 UNIT/GM EX OINT
TOPICAL_OINTMENT | CUTANEOUS | Status: AC
  Filled 2018-04-06: qty 28.35

## 2018-04-06 SURGICAL SUPPLY — 44 items
ATTRACTOMAT 16X20 MAGNETIC DRP (DRAPES) ×3 IMPLANT
BLADE RAD40 ROTATE 4M 4 5PK (BLADE) IMPLANT
BLADE RAD40 ROTATE 4M 4MM 5PK (BLADE)
BLADE RAD60 ROTATE M4 4 5PK (BLADE) IMPLANT
BLADE RAD60 ROTATE M4 4MM 5PK (BLADE)
BLADE TRICUT ROTATE M4 4 5PK (BLADE) ×2 IMPLANT
BLADE TRICUT ROTATE M4 4MM 5PK (BLADE) ×1
BUR HS RAD FRONTAL 3 (BURR) IMPLANT
BUR HS RAD FRONTAL 3MM (BURR)
CANISTER SUC SOCK COL 7IN (MISCELLANEOUS) ×3 IMPLANT
CANISTER SUCT 1200ML W/VALVE (MISCELLANEOUS) ×6 IMPLANT
CORD BIPOLAR FORCEPS 12FT (ELECTRODE) IMPLANT
DECANTER SPIKE VIAL GLASS SM (MISCELLANEOUS) IMPLANT
DRESSING ADAPTIC 1/2  N-ADH (PACKING) IMPLANT
DRESSING NASAL KENNEDY 3.5X.9 (MISCELLANEOUS) IMPLANT
DRSG NASAL KENNEDY 3.5X.9 (MISCELLANEOUS)
DRSG NASAL KENNEDY LMNT 8CM (GAUZE/BANDAGES/DRESSINGS) IMPLANT
DRSG NASOPORE 8CM (GAUZE/BANDAGES/DRESSINGS) ×3 IMPLANT
DRSG TELFA 3X8 NADH (GAUZE/BANDAGES/DRESSINGS) IMPLANT
GAUZE 4X4 16PLY RFD (DISPOSABLE) IMPLANT
GAUZE VASELINE FOILPK 1/2 X 72 (GAUZE/BANDAGES/DRESSINGS) IMPLANT
GLOVE BIO SURGEON STRL SZ 6.5 (GLOVE) ×2 IMPLANT
GLOVE BIO SURGEONS STRL SZ 6.5 (GLOVE) ×1
GLOVE ECLIPSE 7.5 STRL STRAW (GLOVE) ×3 IMPLANT
GOWN STRL REUS W/ TWL LRG LVL3 (GOWN DISPOSABLE) ×2 IMPLANT
GOWN STRL REUS W/TWL LRG LVL3 (GOWN DISPOSABLE) ×6
HEMOSTAT SURGICEL .5X2 ABSORB (HEMOSTASIS) IMPLANT
IV NS 500ML (IV SOLUTION)
IV NS 500ML BAXH (IV SOLUTION) IMPLANT
NEEDLE PRECISIONGLIDE 27X1.5 (NEEDLE) ×3 IMPLANT
NEEDLE SPNL 25GX3.5 QUINCKE BL (NEEDLE) IMPLANT
NS IRRIG 1000ML POUR BTL (IV SOLUTION) ×3 IMPLANT
PACK BASIN DAY SURGERY FS (CUSTOM PROCEDURE TRAY) ×3 IMPLANT
PACK ENT DAY SURGERY (CUSTOM PROCEDURE TRAY) ×3 IMPLANT
PATTIES SURGICAL .5 X3 (DISPOSABLE) ×3 IMPLANT
SLEEVE SCD COMPRESS KNEE MED (MISCELLANEOUS) ×3 IMPLANT
SOLUTION BUTLER CLEAR DIP (MISCELLANEOUS) ×3 IMPLANT
SPONGE GAUZE 2X2 8PLY STER LF (GAUZE/BANDAGES/DRESSINGS) ×1
SPONGE GAUZE 2X2 8PLY STRL LF (GAUZE/BANDAGES/DRESSINGS) ×2 IMPLANT
SPONGE SURGIFOAM ABS GEL 12-7 (HEMOSTASIS) IMPLANT
TOWEL GREEN STERILE FF (TOWEL DISPOSABLE) ×3 IMPLANT
TUBE CONNECTING 20'X1/4 (TUBING) ×1
TUBE CONNECTING 20X1/4 (TUBING) ×2 IMPLANT
YANKAUER SUCT BULB TIP NO VENT (SUCTIONS) ×3 IMPLANT

## 2018-04-06 NOTE — Anesthesia Procedure Notes (Signed)
Procedure Name: Intubation Date/Time: 04/06/2018 7:45 AM Performed by: Gar GibbonKeeton, Shanan Mcmiller S, CRNA Pre-anesthesia Checklist: Patient identified, Emergency Drugs available, Suction available and Patient being monitored Patient Re-evaluated:Patient Re-evaluated prior to induction Oxygen Delivery Method: Circle system utilized Preoxygenation: Pre-oxygenation with 100% oxygen Induction Type: IV induction Ventilation: Mask ventilation without difficulty Laryngoscope Size: Miller and 3 Grade View: Grade II Tube type: Oral Tube size: 8.0 mm Number of attempts: 1 Airway Equipment and Method: Stylet and Oral airway Placement Confirmation: ETT inserted through vocal cords under direct vision,  positive ETCO2 and breath sounds checked- equal and bilateral Secured at: 23 cm Tube secured with: Tape Dental Injury: Teeth and Oropharynx as per pre-operative assessment

## 2018-04-06 NOTE — Transfer of Care (Signed)
Immediate Anesthesia Transfer of Care Note  Patient: Shane Hatfield  Procedure(s) Performed: ENDOSCOPIC ethmoid, maxillary, frontal,sinus SURGERY (Bilateral )  Patient Location: PACU  Anesthesia Type:General  Level of Consciousness: awake and patient cooperative  Airway & Oxygen Therapy: Patient Spontanous Breathing and aerosol face mask  Post-op Assessment: Report given to RN and Post -op Vital signs reviewed and stable  Post vital signs: Reviewed and stable  Last Vitals:  Vitals Value Taken Time  BP 153/88 04/06/2018  9:08 AM  Temp    Pulse 96 04/06/2018  9:15 AM  Resp 15 04/06/2018  9:15 AM  SpO2 100 % 04/06/2018  9:15 AM  Vitals shown include unvalidated device data.  Last Pain:  Vitals:   04/06/18 0653  TempSrc: Oral         Complications: No apparent anesthesia complications

## 2018-04-06 NOTE — H&P (Signed)
Shane Hatfield a 47 y.o.malewhopresents as a new patient for chronic rhinitis and sinusitis. For the past year he reports fluctuating nasal congestion and clear rhinorrhea. Rhinorrhea does not occur daily but frequently. It is bilateral. Also reports some midfacial pressure along the cheek sinuses and nose. He uses Flonase periodically if he feels he needs it. No use of oral anti-histamines. Recent sinus CT [facility] on 11/20/17 demonstrating a prominent nasal septal deviation, chronic pansinusitis with mild polypoid mucoperiosteal thickening with obstruction of the maxillary sinus ostia nad hiatus semilunaris but no sinus effusion to suggest acute sinusitis (per report). Small maxillary sinus retention cysts noted bilaterally as well.  No recent antibiotics. He uses CPAP for OSA.  Denies fever, headache, purulent rhinorrhea, acute vision changes, dizziness, ear or throat symptoms.  No PMH of asthma or aspirin sensitivity. He denies a history of allergies or recurrent sinusitis. No history of nasal trauma or sinonasal surgery. No formal allergy testing.  Current smoker.  PMH/Meds/All/SocHx/FamHx/ROS:   Past Medical History      Past Medical History:  Diagnosis Date  . Allergy   . Hyperlexia   . Hypertension       Past Surgical History       Past Surgical History:  Procedure Laterality Date  . APPENDECTOMY        No family history of bleeding disorders, wound healing problems or difficulty with anesthesia.   Social History  Social History        Social History  . Marital status: Married    Spouse name: N/A  . Number of children: N/A  . Years of education: N/A      Occupational History  . Not on file.           Social History Main Topics    . Smoking status: Current Every Day Smoker   . Smokeless tobacco: Never Used   . Alcohol use Yes      Comment: Social    . Drug use: Unknown  . Sexual activity:  Not on file        Other Topics Concern  . Not on file      Social History Narrative  . No narrative on file       Current Outpatient Prescriptions:  . allopurinol (ZYLOPRIM) 300 MG tablet, Take 300 mg by mouth daily., Disp: , Rfl:  . fluticasone propionate (FLOVENT DISKUS) 50 mcg/actuation diskus inhaler, Inhale 100 mcg into the lungs 2 times daily., Disp: , Rfl:  . levETIRAcetam (KEPPRA) 750 MG tablet, Take 750 mg by mouth 2 times daily., Disp: , Rfl:  . losartan potassium (LOSARTAN ORAL), Take by mouth., Disp: , Rfl:  . pantoprazole (PROTONIX) 40 MG tablet, Take 40 mg by mouth daily., Disp: , Rfl:  . sildenafil (VIAGRA) 50 MG tablet, Take 50 mg by mouth daily as needed for Erectile Dysfunction., Disp: , Rfl:  . terbinafine HCl (LAMISIL) 1 % cream, Apply topically daily., Disp: , Rfl:   A complete ROS was performed with pertinent positives/negatives noted in the HPI. The remainder of the ROS are negative.   Physical Exam:    Ht 1.702 m (5\' 7" )  Wt 97.5 kg (215 lb)  BMI 33.67 kg/m    General Awake, at baseline alertness during examination.  Eyes No scleral icterus or conjunctival hemorrhage. Globe position appears normal. EOMI.   Right Ear EAC with excessive cerumen, unable to fully visualize TM.  Left Ear EAC with excessive cerumen, unable to fully visualize TM.  Nose Patent, no polyps or masses seenon anterior rhinoscopy.  Oral cavity No mucosal lesions or tumors seen. Tongue midline.   Oropharynx Symmetric tonsils.   Neck No abnormal cervical lymphadenopathy. No thyromegaly. No thyroid masses palpated.  Cardio-vascular No cyanosis.  Pulmonary No audible stridor.Breathing easily with no labor.  Neuro Symmetric facial movement.   Psychiatry Appropriate affect and mood for clinic visit.   Independent Review of Additional Tests or Records:  Medical records, outside sinus CT.  Procedures:  None   Impression & Plans:    Shane Hatfield a 47 y.o.malewith one year of fluctuating nasal congestion, clear rhinorrhea and facial pressure. Recent maxillofacial CT demonstrated chronic pansinusitis with obstruction of the osteomeatal complexes and deviated nasal septum.  Recommend a course of Clindamycin 300mg  TID for 10 days (written prescription provided). Eat a yogurt with active cultures daily. Also recommend he use Flonase daily for maximum benefit; 2 puffs each nostril. Also take an oral anti-histamine such as Zyrtec, Allegra or Claritin.   Follow up in three weeks for recheck and repeat maxillofacial CT imaging.  I recommended patient see his primary care soon for elevated blood pressure readings today; he states he is seeing a provider later today.

## 2018-04-06 NOTE — Discharge Instructions (Signed)
Use nasal saline spray every 30-60 minutes while awake. You can purchase this at any pharmacy.  Change drip pad as needed  5mg  Oxycodone given at 10:53am   Post Anesthesia Home Care Instructions  Activity: Get plenty of rest for the remainder of the day. A responsible individual must stay with you for 24 hours following the procedure.  For the next 24 hours, DO NOT: -Drive a car -Advertising copywriterperate machinery -Drink alcoholic beverages -Take any medication unless instructed by your physician -Make any legal decisions or sign important papers.  Meals: Start with liquid foods such as gelatin or soup. Progress to regular foods as tolerated. Avoid greasy, spicy, heavy foods. If nausea and/or vomiting occur, drink only clear liquids until the nausea and/or vomiting subsides. Call your physician if vomiting continues.  Special Instructions/Symptoms: Your throat may feel dry or sore from the anesthesia or the breathing tube placed in your throat during surgery. If this causes discomfort, gargle with warm salt water. The discomfort should disappear within 24 hours.  If you had a scopolamine patch placed behind your ear for the management of post- operative nausea and/or vomiting:  1. The medication in the patch is effective for 72 hours, after which it should be removed.  Wrap patch in a tissue and discard in the trash. Wash hands thoroughly with soap and water. 2. You may remove the patch earlier than 72 hours if you experience unpleasant side effects which may include dry mouth, dizziness or visual disturbances. 3. Avoid touching the patch. Wash your hands with soap and water after contact with the patch.

## 2018-04-06 NOTE — Op Note (Signed)
OPERATIVE REPORT  DATE OF SURGERY: 04/06/2018  PATIENT:  Shane Hatfield,  47 y.o. male  PRE-OPERATIVE DIAGNOSIS:  Chronic pansinusitis  POST-OPERATIVE DIAGNOSIS:  Chronic pansinusitis  PROCEDURE:  Procedure(s): ENDOSCOPIC ethmoid, maxillary, frontal,sinus SURGERY  SURGEON:  Susy FrizzleJefry H Jay Haskew, MD  ASSISTANTS: None  ANESTHESIA:   General   EBL: 50 ml  DRAINS: None  LOCAL MEDICATIONS USED: 1% Xylocaine with epinephrine  SPECIMEN: Bilateral sinus contents  COUNTS:  Correct  PROCEDURE DETAILS: The patient was taken to the operating room and placed on the operating table in the supine position. Following induction of general endotracheal  anesthesia, the nose was draped in standard fashion.  Oxymetazoline spray was used preoperatively in the nasal cavities.  1% Xylocaine with epinephrine was infiltrated into the posterior and superior attachments of the middle turbinates and the lateral nasal wall bilaterally.  1.  Bilateral endoscopic total ethmoidectomy.  On the left side, the uncinate process was taken down at its base using a sickle knife.  The microdebrider was then used to perform a complete ethmoidectomy on the left.  The lamina papyracea was the lateral limit of dissection and was kept intact.  The fovea was kept intact superiorly and was the superior limit.  The grand lamella was opened exposing the posterior cells.  There is diffuse polypoid changes throughout the ethmoid cells anterior and posterior.  A similar dissection was performed on the left side although procedure was aborted before all diseased tissue could be removed.  Majority of the ethmoid cavity was opened including the posterior cells.  Fovea and lamina papyracea was kept intact.  2.  Bilateral maxillary antrostomy.  Using the 30 degree endoscope and a curved suction the anterior fontanelle was entered and the maxillary antrum was exposed.  On the left side the antrostomy was enlarged using backbiting forceps  anteriorly and through cut forceps posteriorly.  On the right side the posterior aspect was enlarged using the through cut forceps but I was unable to complete the anterior aspect due to the blood pressure problems.  3.  Bilateral endoscopic frontal sinusotomy.  After the ethmoid dissection was completed on both sides the 30 degree scope was used to visualize the frontal recess.  There is some polypoid changes bilaterally that were cleared out using up-biting and giraffe forceps.  This was completed on the left and only partially cleaned out on the right due to the blood pressure problems.  Nasal pore packing was placed in the ethmoid cavity bilaterally, and coated with bacitracin ointment.  The nasal and sinus cavities were suctioned of blood and secretions and the pharynx was suctioned of blood and secretions as well.  During the procedure, there were problems maintaining blood pressure.  Initially he was hypotensive.  With positioning and medical intervention, this was reversed and then he became hypertensive.  Due to the lability of the blood pressure in order to prevent cardiovascular complications, I elected to not complete the entire right side dissection.  The patient was awakened extubated and transferred to recovery in stable condition.    PATIENT DISPOSITION:  To PACU, stable

## 2018-04-09 NOTE — Anesthesia Postprocedure Evaluation (Signed)
Anesthesia Post Note  Patient: Shane LuxGerald L Plumb  Procedure(s) Performed: ENDOSCOPIC ethmoid, maxillary, frontal,sinus SURGERY (Bilateral )     Patient location during evaluation: PACU Anesthesia Type: General Level of consciousness: awake Pain management: pain level controlled Vital Signs Assessment: post-procedure vital signs reviewed and stable Respiratory status: spontaneous breathing Cardiovascular status: stable Postop Assessment: no apparent nausea or vomiting Anesthetic complications: no    Last Vitals:  Vitals:   04/06/18 1030 04/06/18 1111  BP: (!) 158/89 (!) 155/80  Pulse: 99 93  Resp: 18 18  Temp:  (!) 36.2 C  SpO2: 99% 95%    Last Pain:  Vitals:   04/06/18 1111  TempSrc:   PainSc: 3    Pain Goal:                 [place] JR,JOHN Birney Belshe

## 2018-04-10 ENCOUNTER — Encounter (HOSPITAL_BASED_OUTPATIENT_CLINIC_OR_DEPARTMENT_OTHER): Payer: Self-pay | Admitting: Otolaryngology

## 2018-08-08 ENCOUNTER — Emergency Department (HOSPITAL_COMMUNITY)
Admission: EM | Admit: 2018-08-08 | Discharge: 2018-08-09 | Disposition: A | Discharge status: Home / Self Care | Payer: No Typology Code available for payment source | Attending: Emergency Medicine | Admitting: Emergency Medicine

## 2018-08-08 ENCOUNTER — Other Ambulatory Visit: Payer: Self-pay

## 2018-08-08 ENCOUNTER — Encounter (HOSPITAL_COMMUNITY): Payer: Self-pay

## 2018-08-08 DIAGNOSIS — R569 Unspecified convulsions: Secondary | ICD-10-CM | POA: Insufficient documentation

## 2018-08-08 DIAGNOSIS — N390 Urinary tract infection, site not specified: Secondary | ICD-10-CM | POA: Insufficient documentation

## 2018-08-08 DIAGNOSIS — Z79899 Other long term (current) drug therapy: Secondary | ICD-10-CM | POA: Insufficient documentation

## 2018-08-08 DIAGNOSIS — F17210 Nicotine dependence, cigarettes, uncomplicated: Secondary | ICD-10-CM | POA: Insufficient documentation

## 2018-08-08 DIAGNOSIS — I10 Essential (primary) hypertension: Secondary | ICD-10-CM | POA: Insufficient documentation

## 2018-08-08 DIAGNOSIS — R319 Hematuria, unspecified: Secondary | ICD-10-CM | POA: Diagnosis not present

## 2018-08-08 DIAGNOSIS — R310 Gross hematuria: Secondary | ICD-10-CM

## 2018-08-08 DIAGNOSIS — R300 Dysuria: Secondary | ICD-10-CM | POA: Diagnosis present

## 2018-08-08 LAB — URINALYSIS, MICROSCOPIC (REFLEX): WBC, UA: >50 WBC/hpf (ref 0–5)

## 2018-08-08 LAB — URINALYSIS, ROUTINE W REFLEX MICROSCOPIC

## 2018-08-08 NOTE — ED Triage Notes (Signed)
Patient  reports that he has had dysuria and hematuria x 1 month. Patient states he has been to [facility] twice to see about this. Patient states [facility] but feels like he can not wait for the appointment.

## 2018-08-09 MED ORDER — CEPHALEXIN 500 MG PO CAPS: 500.0000 mg | ORAL_CAPSULE | Freq: Two times a day (BID) | ORAL | 0 refills | Status: AC

## 2018-08-09 MED ORDER — CEPHALEXIN 500 MG PO CAPS
500.0000 mg | ORAL_CAPSULE | Freq: Once | ORAL | Status: AC
  Administered 2018-08-09: 500 mg via ORAL
  Filled 2018-08-09: qty 1

## 2018-08-09 NOTE — ED Provider Notes (Signed)
[facility]-EMERGENCY DEPT Provider Note   CSN: [id number] Arrival date & time: 08/08/18  1637     History   Chief Complaint Chief Complaint  Patient presents with  . Hematuria  . Dysuria    HPI Shane Hatfield is a 47 y.o. male.  HPI  47 year old male presents with hematuria.  He states he is been having hematuria for about a month or more.  He normally gets his care at the [facility] and states that he has seen his PCP and gone to the ER for this.  Last saw his PCP about 8 days ago and was told he had normal blood work.  He states that no one has seen any abnormalities in the urine.  He was put on some sort of medicine to help with the dysuria he has occasionally at the tip of his penis and states he was told he would turn to his urine orange.  He was also placed on antibiotic last week that he cannot remember the name of.  When I mention multiple common meds, he thinks it was ciprofloxacin.  He denies fever, vomiting, back or flank pain or abdominal pain.  A little bit of pain at the tip of his penis when he urinates but otherwise no penile pain or testicular pain.  No feeling of urinary retention or difficulty emptying his bladder. He does not take a blood thinner or aspirin.  Past Medical History:  Diagnosis Date  . Chronic sinusitis   . GERD (gastroesophageal reflux disease)   . Hypertension   . Seizures (HCC)   . Sleep apnea    Uses Cpap    There are no active problems to display for this patient.   Past Surgical History:  Procedure Laterality Date  . APPENDECTOMY    . NASAL SINUS SURGERY Bilateral 04/06/2018   Procedure: ENDOSCOPIC ethmoid, maxillary, frontal,sinus SURGERY;  Surgeon: Serena Colonelosen, Jefry, MD;  Location: [facility];  Service: ENT;  Laterality: Bilateral;  Bilateral endoscopic ethmoid, maxillary, frontal, sphenoid sinus surgery        Home Medications    Prior to Admission medications   Medication Sig Start Date End Date  Taking? Authorizing Provider  allopurinol (ZYLOPRIM) 300 MG tablet Take 300 mg by mouth daily.    [provider]  cephALEXin (KEFLEX) 500 MG capsule Take 1 capsule (500 mg total) by mouth 2 (two) times daily for 5 days. 08/09/18 08/14/18  Pricilla LovelessGoldston, Mala Gibbard, MD  fluticasone (FLONASE) 50 MCG/ACT nasal spray Place 2 sprays into both nostrils daily as needed for allergies or rhinitis.    [provider]  HYDROcodone-acetaminophen (NORCO) 7.5-325 MG tablet Take 1 tablet by mouth every 6 (six) hours as needed for moderate pain. 04/06/18   Serena Colonelosen, Jefry, MD  hydrocortisone cream 1 % Apply 1 application topically daily as needed (eczema).    [provider]  levETIRAcetam (KEPPRA) 250 MG tablet Take 1,000 mg by mouth daily.    [provider]  losartan (COZAAR) 100 MG tablet Take 100 mg by mouth daily.    [provider]  pantoprazole (PROTONIX) 20 MG tablet Take 20 mg by mouth 2 (two) times daily.    [provider]  promethazine (PHENERGAN) 25 MG suppository Place 1 suppository (25 mg total) rectally every 6 (six) hours as needed for nausea or vomiting. 04/06/18   Serena Colonelosen, Jefry, MD    Family History Family History  Problem Relation Age of Onset  . Diabetes Mother   .  Heart failure Mother     Social History Social History   Tobacco Use  . Smoking status: Current Some Day Smoker    Packs/day: 0.25    Types: Cigarettes  . Smokeless tobacco: Never Used  Substance Use Topics  . Alcohol use: Yes    Comment: Ocassionally  . Drug use: No     Allergies   Latex; Dilantin [phenytoin]; and Tomato   Review of Systems Review of Systems  Constitutional: Negative for fever.  Gastrointestinal: Negative for abdominal pain and vomiting.  Genitourinary: Positive for dysuria, hematuria and penile pain. Negative for flank pain and testicular pain.  Musculoskeletal: Negative for back pain.  All other systems reviewed and are negative.    Physical  Exam Updated Vital Signs BP (!) 159/105 (BP Location: Left Arm)   Pulse 92   Temp 98.3 F (36.8 C) (Oral)   Resp 18   Ht 5\' 7"  (1.702 m)   Wt 96.2 kg   SpO2 96%   BMI 33.20 kg/m   Physical Exam Vitals signs and nursing note reviewed.  Constitutional:      Appearance: He is well-developed.  HENT:     Head: Normocephalic and atraumatic.     Right Ear: External ear normal.     Left Ear: External ear normal.     Nose: Nose normal.  Eyes:     General:        Right eye: No discharge.        Left eye: No discharge.  Neck:     Musculoskeletal: Neck supple.  Cardiovascular:     Rate and Rhythm: Normal rate and regular rhythm.     Heart sounds: Normal heart sounds.  Pulmonary:     Effort: Pulmonary effort is normal.     Breath sounds: Normal breath sounds.  Abdominal:     Palpations: Abdomen is soft.     Tenderness: There is no abdominal tenderness.  Genitourinary:    Penis: No erythema, tenderness or discharge.      Scrotum/Testes:        Right: Tenderness not present.        Left: Tenderness not present.  Skin:    General: Skin is warm and dry.  Neurological:     Mental Status: He is alert.  Psychiatric:        Mood and Affect: Mood is not anxious.      ED Treatments / Results  Labs (all labs ordered are listed, but only abnormal results are displayed) Labs Reviewed  URINALYSIS, ROUTINE W REFLEX MICROSCOPIC - Abnormal; Notable for the following components:      Result Value   Color, Urine ORANGE (*)    APPearance HAZY (*)    Glucose, UA   (*)    Value: TEST NOT REPORTED DUE TO COLOR INTERFERENCE OF URINE PIGMENT   Hgb urine dipstick   (*)    Value: TEST NOT REPORTED DUE TO COLOR INTERFERENCE OF URINE PIGMENT   Bilirubin Urine   (*)    Value: TEST NOT REPORTED DUE TO COLOR INTERFERENCE OF URINE PIGMENT   Ketones, ur   (*)    Value: TEST NOT REPORTED DUE TO COLOR INTERFERENCE OF URINE PIGMENT   Protein, ur   (*)    Value: TEST NOT REPORTED DUE TO COLOR  INTERFERENCE OF URINE PIGMENT   Nitrite   (*)    Value: TEST NOT REPORTED DUE TO COLOR INTERFERENCE OF URINE PIGMENT   Leukocytes, UA   (*)  Value: TEST NOT REPORTED DUE TO COLOR INTERFERENCE OF URINE PIGMENT   All other components within normal limits  URINALYSIS, MICROSCOPIC (REFLEX) - Abnormal; Notable for the following components:   Bacteria, UA RARE (*)    All other components within normal limits  URINE CULTURE    EKG None  Radiology No results found.  Procedures Procedures (including critical care time)  Medications Ordered in ED Medications  cephALEXin (KEFLEX) capsule 500 mg (has no administration in time range)     Initial Impression / Assessment and Plan / ED Course  I have reviewed the triage vital signs and the nursing notes.  Pertinent labs & imaging results that were available during my care of the patient were reviewed by me and considered in my medical decision making (see chart for details).     I discussed with patient that Cipro often has resistance and this may not be the best treatment for possible UTI.  Urinalysis testing is limited due to the color of the urine, which may partially be of the meds he is taking.  He does have some red blood cells in the urine but also greater than 50 WBCs.  While there is rare bacteria I think treating him with antibiotics is reasonable.  I will switch him to Keflex.  He will also be referred to urology here as he does not have a urology appointment until the end of January and he would like to be seen sooner.  He does not have any signs or symptoms of dizziness or lightheadedness and does not want labs checked today which I think is reasonable given stable vital signs.  Final Clinical Impressions(s) / ED Diagnoses   Final diagnoses:  Gross hematuria  Acute urinary tract infection    ED Discharge Orders         Ordered    cephALEXin (KEFLEX) 500 MG capsule  2 times daily     08/09/18 0019           Pricilla Loveless, MD 08/09/18 [phone]

## 2018-08-09 NOTE — Discharge Instructions (Signed)
If you develop dizziness, lightheadedness, passing blood clots, abdominal, flank, or back pain, or any other new/concerning symptoms then return to the ER for evaluation.

## 2018-08-10 LAB — URINE CULTURE: Culture: <10000 — AB

## 2018-09-21 ENCOUNTER — Emergency Department (HOSPITAL_COMMUNITY)
Admission: EM | Admit: 2018-09-21 | Discharge: 2018-09-22 | Discharge status: Against Medical Advice | Payer: No Typology Code available for payment source

## 2018-09-21 NOTE — ED Notes (Signed)
Patient didn't wait to be triaged.  Patient asked registration to remove arm band because he was leaving due to long wait.

## 2022-07-25 ENCOUNTER — Other Ambulatory Visit (HOSPITAL_COMMUNITY): Payer: Self-pay | Admitting: Family Medicine

## 2022-07-25 DIAGNOSIS — M25512 Pain in left shoulder: Secondary | ICD-10-CM

## 2022-08-07 ENCOUNTER — Ambulatory Visit (HOSPITAL_COMMUNITY)
Admission: RE | Admit: 2022-08-07 | Discharge: 2022-08-07 | Disposition: A | Discharge status: Home / Self Care | Payer: No Typology Code available for payment source | Source: Ambulatory Visit | Attending: Family Medicine | Admitting: Family Medicine

## 2022-08-07 DIAGNOSIS — M25512 Pain in left shoulder: Secondary | ICD-10-CM | POA: Diagnosis present
